# Patient Record
Sex: Male | Born: 1965 | Race: White | Hispanic: No | Marital: Married | Smoking: Former smoker
Health system: Southern US, Community
[De-identification: ages and names within clinical notes are randomized; demographics above are authoritative.]

## PROBLEM LIST (undated history)

## (undated) DIAGNOSIS — R519 Headache, unspecified: Secondary | ICD-10-CM

## (undated) DIAGNOSIS — G43909 Migraine, unspecified, not intractable, without status migrainosus: Secondary | ICD-10-CM

## (undated) DIAGNOSIS — F32A Depression, unspecified: Secondary | ICD-10-CM

---

## 2020-10-14 ENCOUNTER — Inpatient Hospital Stay (HOSPITAL_COMMUNITY)
Admission: EM | Admit: 2020-10-14 | Discharge: 2020-10-18 | DRG: 082 | Disposition: A | Discharge status: Home Health | Attending: Neurosurgery | Admitting: Neurosurgery

## 2020-10-14 ENCOUNTER — Emergency Department (HOSPITAL_COMMUNITY)

## 2020-10-14 ENCOUNTER — Encounter (HOSPITAL_COMMUNITY): Payer: Self-pay | Admitting: Neurosurgery

## 2020-10-14 DIAGNOSIS — R600 Localized edema: Secondary | ICD-10-CM | POA: Diagnosis present

## 2020-10-14 DIAGNOSIS — Z23 Encounter for immunization: Secondary | ICD-10-CM

## 2020-10-14 DIAGNOSIS — U071 COVID-19: Secondary | ICD-10-CM | POA: Diagnosis not present

## 2020-10-14 DIAGNOSIS — T1490XA Injury, unspecified, initial encounter: Secondary | ICD-10-CM | POA: Diagnosis present

## 2020-10-14 DIAGNOSIS — S0633AA Contusion and laceration of cerebrum, unspecified, with loss of consciousness status unknown, initial encounter: Secondary | ICD-10-CM | POA: Diagnosis present

## 2020-10-14 DIAGNOSIS — S06339A Contusion and laceration of cerebrum, unspecified, with loss of consciousness of unspecified duration, initial encounter: Secondary | ICD-10-CM | POA: Diagnosis not present

## 2020-10-14 DIAGNOSIS — S0083XA Contusion of other part of head, initial encounter: Secondary | ICD-10-CM

## 2020-10-14 HISTORY — DX: Headache, unspecified: R51.9

## 2020-10-14 HISTORY — DX: Depression, unspecified: F32.A

## 2020-10-14 HISTORY — DX: Migraine, unspecified, not intractable, without status migrainosus: G43.909

## 2020-10-14 LAB — I-STAT CHEM 8, ED
BUN: 12 mg/dL (ref 6–20)
Calcium, Ion: 1.13 mmol/L — ABNORMAL LOW (ref 1.15–1.40)
Chloride: 106 mmol/L (ref 98–111)
Creatinine, Ser: 0.8 mg/dL (ref 0.61–1.24)
Glucose, Bld: 108 mg/dL — ABNORMAL HIGH (ref 70–99)
HCT: 42 % (ref 39.0–52.0)
Hemoglobin: 14.3 g/dL (ref 13.0–17.0)
Potassium: 3.6 mmol/L (ref 3.5–5.1)
Sodium: 145 mmol/L (ref 135–145)
TCO2: 25 mmol/L (ref 22–32)

## 2020-10-14 LAB — CBC
HCT: 36.4 % — ABNORMAL LOW (ref 39.0–52.0)
Hemoglobin: 11.9 g/dL — ABNORMAL LOW (ref 13.0–17.0)
MCH: 29.9 pg (ref 26.0–34.0)
MCHC: 32.7 g/dL (ref 30.0–36.0)
MCV: 91.5 fL (ref 80.0–100.0)
Platelets: 174 10*3/uL (ref 150–400)
RBC: 3.98 MIL/uL — ABNORMAL LOW (ref 4.22–5.81)
RDW: 11.9 % (ref 11.5–15.5)
WBC: 6.5 10*3/uL (ref 4.0–10.5)
nRBC: 0 % (ref 0.0–0.2)

## 2020-10-14 LAB — URINALYSIS, ROUTINE W REFLEX MICROSCOPIC
Bilirubin Urine: NEGATIVE
Glucose, UA: NEGATIVE mg/dL
Hgb urine dipstick: NEGATIVE
Ketones, ur: NEGATIVE mg/dL
Leukocytes,Ua: NEGATIVE
Nitrite: NEGATIVE
Protein, ur: NEGATIVE mg/dL
Specific Gravity, Urine: 1.02 (ref 1.005–1.030)
pH: 6.5 (ref 5.0–8.0)

## 2020-10-14 LAB — COMPREHENSIVE METABOLIC PANEL
ALT: 63 U/L — ABNORMAL HIGH (ref 0–44)
AST: 70 U/L — ABNORMAL HIGH (ref 15–41)
Albumin: 3.9 g/dL (ref 3.5–5.0)
Alkaline Phosphatase: 77 U/L (ref 38–126)
Anion gap: 11 (ref 5–15)
BUN: 10 mg/dL (ref 6–20)
CO2: 26 mmol/L (ref 22–32)
Calcium: 9 mg/dL (ref 8.9–10.3)
Chloride: 107 mmol/L (ref 98–111)
Creatinine, Ser: 0.88 mg/dL (ref 0.61–1.24)
GFR, Estimated: >60 mL/min (ref >60)
Glucose, Bld: 113 mg/dL — ABNORMAL HIGH (ref 70–99)
Potassium: 3.6 mmol/L (ref 3.5–5.1)
Sodium: 144 mmol/L (ref 135–145)
Total Bilirubin: 0.8 mg/dL (ref 0.3–1.2)
Total Protein: 6 g/dL — ABNORMAL LOW (ref 6.5–8.1)

## 2020-10-14 LAB — ETHANOL: Alcohol, Ethyl (B): <10 mg/dL (ref <10)

## 2020-10-14 LAB — RESP PANEL BY RT-PCR (FLU A&B, COVID) ARPGX2
Influenza A by PCR: NEGATIVE
Influenza B by PCR: NEGATIVE
SARS Coronavirus 2 by RT PCR: POSITIVE — AB

## 2020-10-14 LAB — SAMPLE TO BLOOD BANK

## 2020-10-14 LAB — PROTIME-INR
INR: 1.1 (ref 0.8–1.2)
Prothrombin Time: 13.9 seconds (ref 11.4–15.2)

## 2020-10-14 LAB — MRSA PCR SCREENING: MRSA by PCR: NEGATIVE

## 2020-10-14 LAB — LACTIC ACID, PLASMA: Lactic Acid, Venous: 1.8 mmol/L (ref 0.5–1.9)

## 2020-10-14 MED ORDER — ACETAMINOPHEN 650 MG RE SUPP: 650.0000 mg | Freq: Four times a day (QID) | RECTAL | Status: DC | PRN

## 2020-10-14 MED ORDER — SODIUM CHLORIDE 0.9% FLUSH: 3.0000 mL | INTRAVENOUS | Status: DC | PRN

## 2020-10-14 MED ORDER — MORPHINE SULFATE (PF) 4 MG/ML IV SOLN
4.0000 mg | Freq: Once | INTRAVENOUS | Status: AC
  Administered 2020-10-14: 4 mg via INTRAVENOUS
  Filled 2020-10-14: qty 1

## 2020-10-14 MED ORDER — ACETAMINOPHEN 325 MG PO TABS
650.0000 mg | ORAL_TABLET | Freq: Four times a day (QID) | ORAL | Status: DC | PRN
  Administered 2020-10-15: 650 mg via ORAL
  Filled 2020-10-14: qty 2

## 2020-10-14 MED ORDER — SODIUM CHLORIDE 0.9 % IV SOLN: 250.0000 mL | INTRAVENOUS | Status: DC | PRN

## 2020-10-14 MED ORDER — HYDROMORPHONE HCL 1 MG/ML IJ SOLN: 0.5000 mg | INTRAMUSCULAR | Status: DC | PRN

## 2020-10-14 MED ORDER — ONDANSETRON HCL 4 MG/2ML IJ SOLN
4.0000 mg | Freq: Once | INTRAMUSCULAR | Status: AC
  Administered 2020-10-14: 4 mg via INTRAVENOUS
  Filled 2020-10-14: qty 2

## 2020-10-14 MED ORDER — SODIUM CHLORIDE 0.9% FLUSH
3.0000 mL | Freq: Two times a day (BID) | INTRAVENOUS | Status: DC
  Administered 2020-10-15 – 2020-10-17 (×3): 3 mL via INTRAVENOUS

## 2020-10-14 MED ORDER — ONDANSETRON HCL 4 MG/2ML IJ SOLN: 4.0000 mg | Freq: Four times a day (QID) | INTRAMUSCULAR | Status: DC | PRN

## 2020-10-14 MED ORDER — TETANUS-DIPHTH-ACELL PERTUSSIS 5-2.5-18.5 LF-MCG/0.5 IM SUSY
0.5000 mL | PREFILLED_SYRINGE | Freq: Once | INTRAMUSCULAR | Status: AC
  Administered 2020-10-14: 0.5 mL via INTRAMUSCULAR
  Filled 2020-10-14: qty 0.5

## 2020-10-14 MED ORDER — LEVETIRACETAM 500 MG PO TABS
500.0000 mg | ORAL_TABLET | Freq: Two times a day (BID) | ORAL | Status: DC
  Administered 2020-10-14 – 2020-10-18 (×8): 500 mg via ORAL
  Filled 2020-10-14 (×8): qty 1

## 2020-10-14 MED ORDER — METHOCARBAMOL 1000 MG/10ML IJ SOLN
500.0000 mg | Freq: Four times a day (QID) | INTRAVENOUS | Status: DC | PRN
  Filled 2020-10-14: qty 5

## 2020-10-14 MED ORDER — ONDANSETRON HCL 4 MG PO TABS: 4.0000 mg | ORAL_TABLET | Freq: Four times a day (QID) | ORAL | Status: DC | PRN

## 2020-10-14 MED ORDER — OXYCODONE HCL 5 MG PO TABS: 5.0000 mg | ORAL_TABLET | ORAL | Status: DC | PRN

## 2020-10-14 MED ORDER — SODIUM CHLORIDE 0.9 % IV SOLN: INTRAVENOUS | Status: DC

## 2020-10-14 NOTE — Progress Notes (Signed)
   10/14/20 1200  Clinical Encounter Type  Visited With Patient not available  Visit Type Trauma  Referral From Nurse  Consult/Referral To Chaplain  The chaplain responded to page. The patient is unavailable but is being assessed. There is no family present. Chaplain will follow up as needed.

## 2020-10-14 NOTE — ED Notes (Signed)
[phone] French Ana Daughter
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
## 2020-10-15 ENCOUNTER — Observation Stay (HOSPITAL_COMMUNITY)

## 2020-10-15 DIAGNOSIS — U071 COVID-19: Secondary | ICD-10-CM | POA: Diagnosis present

## 2020-10-15 DIAGNOSIS — S06339A Contusion and laceration of cerebrum, unspecified, with loss of consciousness of unspecified duration, initial encounter: Secondary | ICD-10-CM | POA: Diagnosis present

## 2020-10-15 DIAGNOSIS — T1490XA Injury, unspecified, initial encounter: Secondary | ICD-10-CM | POA: Diagnosis present

## 2020-10-15 DIAGNOSIS — Z23 Encounter for immunization: Secondary | ICD-10-CM | POA: Diagnosis not present

## 2020-10-15 DIAGNOSIS — R600 Localized edema: Secondary | ICD-10-CM | POA: Diagnosis present

## 2020-10-15 LAB — HIV ANTIBODY (ROUTINE TESTING W REFLEX): HIV Screen 4th Generation wRfx: NONREACTIVE

## 2020-10-15 NOTE — Progress Notes (Signed)
Inpatient Rehab Admissions Coordinator Note:   Per PT recommendation, pt was screened for CIR candidacy by Wolfgang Phoenix, MS, CCC-SLP.  At this time we are not recommending an inpatient rehab consult.  Noted COVID + 10/14/20.  Patients are eligible to be considered for admit to CIR when cleared from airborne precautions by acute MD or Infectious Disease regardless of date of onset.  Otherwise, they will need to be >20 days from their positive test with recovery/improvement in symptoms or 2 negative tests.  Please contact me with questions.    Wolfgang Phoenix, MS, CCC-SLP Admissions Coordinator [phone] 10/15/20 4:54 PM
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
## 2020-10-15 NOTE — Progress Notes (Signed)
Patient alert and oriented to self, time and place overnight but disoriented to situation. Very restless in bed but every time pain was assessed he denied having any pain and denied pain medication, always stating it was 0/10. Full movement and strength in all extremities, R eye round and briskly reactive, unable to assess L eye due to swelling. Patient a standby steadying assist when getting up to use urinal or transfer to chair.

## 2020-10-15 NOTE — Progress Notes (Signed)
   10/15/20 0824  Vitals  BP (!) 141/93  MAP (mmHg) 110  BP Location Left Arm  BP Method Automatic  Patient Position (if appropriate) Lying  Pulse Rate 61  ECG Heart Rate 64  Resp 18  MEWS COLOR  MEWS Score Color Green  Oxygen Therapy  SpO2 95 %  O2 Device Room Air  MEWS Score  MEWS Temp 0  MEWS Systolic 0  MEWS Pulse 0  MEWS RR 0  MEWS LOC 0  MEWS Score 0  Patient was reported to be alert orient to self and place and situation overnight. This morning he is only oriented to self. He was able to tell me his 2 names. Appears sleepy and restless. Noted stat order for CT.head.
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
## 2020-10-16 NOTE — Progress Notes (Signed)
SLP Cancellation Note  Patient Details Name: Rick Valencia MRN: [id number] DOB: 1965-11-22   Cancelled treatment:       Pt unable/unwilling to participate in cognitive communication evaluation at this time. Pt answered very few questions despite significant encouragement to participate. 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 He was unable to tell me what he does for a living. Will continue efforts.  Geneieve Duell B. Murvin Natal, [facility], CCC-SLP Speech Language Pathologist Office: [phone] Pager: [phone]  Leigh Aurora 10/16/2020, 11:08 AM

## 2020-10-16 NOTE — Patient Instructions (Addendum)
Concussion, Adult  A concussion is a brain injury from a hard, direct hit (trauma) to your head or body. This direct hit causes your brain to quickly shake back and forth inside your skull. A concussion may also be called a mild traumatic brain injury (TBI). Healing from this injury can take time. What are the causes? This condition is caused by:  A direct hit to your head, such as: ? Running into a player during a game. ? Being hit in a fight. ? Hitting your head on a hard surface.  A quick and sudden movement of the head or neck, such as in a car crash. What are the signs or symptoms? The signs of a concussion can be hard to notice. They may be missed by you, family members, and doctors. You may look fine on the outside but may not act or feel normal. Physical symptoms  Headaches.  Being dizzy.  Problems with body balance.  Being sensitive to light or noise.  Vomiting or feeling like you may vomit.  Being tired.  Problems seeing or hearing.  Not sleeping or eating as you used to.  Seizure. Mental and emotional symptoms  Feeling grouchy (irritable).  Having mood changes.  Problems remembering things.  Trouble focusing your mind (concentrating), organizing, or making decisions.  Being slow to think, act, react, speak, or read.  Feeling worried or nervous (anxious).  Feeling sad (depressed). How is this treated? This condition may be treated by:  Stopping sports or activity if you are injured. If you hit your head or have signs of concussion: ? Do not return to sports or activities the same day. ? Get checked by a doctor before you return to your activities.  Resting your body and your mind.  Being watched carefully, often at home.  Medicines to help with symptoms such as: ? Headaches. ? Feeling like you may vomit. ? Problems with sleep.  Avoiding alcohol and drugs.  Being asked to go to a concussion clinic or a place to help you recover  (rehabilitation center). Recovery from a concussion can take time. Return to activities only:  When you are fully healed.  When your doctor says it is safe. Avoid taking strong pain medicines (opioids) for a concussion. Follow these instructions at home: Activity  Limit activities that need a lot of thought or focus, such as: ? Homework or work for your job. ? Watching TV. ? Using the computer or phone. ? Playing memory games and puzzles.  Rest. Rest helps your brain heal. Make sure you: ? Get plenty of sleep. Most adults should get 7-9 hours of sleep each night. ? Rest during the day. Take naps or breaks when you feel tired.  Avoid activity like exercise until your doctor says its safe. Stop any activity that makes symptoms worse.  Do not do activities that could cause a second concussion, such as riding a bike or playing sports.  Ask your doctor when you can return to your normal activities, such as school, work, sports, and driving. Your ability to react may be slower. Do not do these activities if you are dizzy. General instructions  Take over-the-counter and prescription medicines only as told by your doctor.  Do not drink alcohol until your doctor says you can.  Watch your symptoms and tell other people to do the same. Other problems can occur after a concussion. Older adults have a higher risk of serious problems.  Tell your work manager, teachers, school nurse, school   counselor, coach, or athletic trainer about your injury and symptoms. Tell them about what you can or cannot do.  Keep all follow-up visits as told by your doctor. This is important.   How is this prevented? It is very important that you do not get another brain injury. In rare cases, another injury can cause brain damage that will not go away, brain swelling, or death. The risk of this is greatest in the first 7-10 days after a head injury. To avoid injuries:  Stop activities that could lead to a second  concussion, such as contact sports, until your doctor says it is okay.  When you return to sports or activities: ? Do not crash into other players. This is how most concussions happen. ? Follow the rules. ? Respect other players. Do not engage in violent behavior while playing.  Get regular exercise. Do strength and balance training.  Wear a helmet that fits you well during sports, biking, or other activities.  Helmets can help protect you from serious skull and brain injuries, but they do not protect you from a concussion. Even when wearing a helmet, you should avoid being hit in the head. Contact a doctor if:  Your symptoms do not get better.  You have new symptoms.  You have another injury. Get help right away if:  You have bad headaches or your headaches get worse.  You feel weak or numb in any part of your body.  You feel mixed up (confused).  Your balance gets worse.  You vomit often.  You feel more sleepy than normal.  You cannot speak well, or have slurred speech.  You have a seizure.  Others have trouble waking you up.  You have changes in how you act.  You have changes in how you see (vision).  You pass out (lose consciousness). These symptoms may be an emergency. Do not wait to see if the symptoms will go away. Get medical help right away. Call your local emergency services (911 in the [place] Do not drive yourself to the hospital. Summary  A concussion is a brain injury from a hard, direct hit (trauma) to your head or body.  This condition is treated with rest and careful watching of symptoms.  Ask your doctor when you can return to your normal activities, such as school, work, or driving.  Get help right away if you have a very bad headache, feel weak in any part of your body, have a seizure, have changes in how you act or see, or if you are mixed up or more sleepy than normal. This information is not intended to replace advice given to you by your  health care provider. Make sure you discuss any questions you have with your health care provider. Document Revised: 07/01/2019 Document Reviewed: 07/01/2019 Elsevier Patient Education  2021 [employer]  

## 2020-10-16 NOTE — Progress Notes (Addendum)
Patient is more alert and awake today with RN. He was able to ambulate in room to [facility] and chair with standby assist. He answers to some of my questions appropriately, follow command but noted withdrawn and not interactive.  PA at bedside ok to d/c TELE/TELE sitter and IVF at this time, patient possible be discharge today.
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
## 2020-10-17 LAB — SARS CORONAVIRUS 2 (TAT 6-24 HRS): SARS Coronavirus 2: NEGATIVE
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
## 2020-10-18 LAB — SARS CORONAVIRUS 2 (TAT 6-24 HRS): SARS Coronavirus 2: NEGATIVE

## 2020-10-18 MED ORDER — LEVETIRACETAM 500 MG PO TABS: 500.0000 mg | ORAL_TABLET | Freq: Two times a day (BID) | ORAL | 0 refills | Status: AC

## 2020-10-18 NOTE — Discharge Instructions (Signed)
Facial or Scalp Contusion A facial or scalp contusion is a deep bruise (contusion) on the face or head. Bruises happen when an injury causes bleeding under the skin. The bruise may turn blue, purple, or yellow. Minor injuries will cause a bruise that is not painful, but worse bruises can stay painful and swollen for a few weeks. Injuries to the face and head usually cause a lot of swelling, especially around the eyes. What are the causes?  An injury to the face or head from an object.  A fall.  A hit to the face or head area. What are the signs or symptoms?  Swelling in the area of the injury.  The injured area being a different color from normal.  Pain or soreness in the injured area. How is this treated?  Applying cold compresses to the hurt area. This is often the best treatment.  Taking over-the-counter medicines to help take the pain away, if your doctor tells you to take them. Follow these instructions at home: Managing pain, stiffness, and swelling  If told, put ice on the injured area. To do this: ? Put ice in a plastic bag. ? Place a towel between your skin and the bag. ? Leave the ice on for 20 minutes, 2-3 times a day.  If possible, raise (elevate) your head above the level of your heart while you are lying down. This may help reduce swelling.   General instructions  Take over-the-counter and prescription medicines only as told by your doctor.  Rest as told by your doctor.  Return to your normal activities as told by your doctor. Ask your doctor what activities are safe for you.  Keep all follow-up visits as told by your doctor. This is important. Contact a doctor if:  You have trouble biting or chewing.  Your pain or swelling gets worse.  You have pain when you move your eyes. Get help right away if:  You have very bad pain or a headache, and medicine does not help.  You are very tired or confused.  Your personality changes.  You vomit.  You have a  nosebleed that does not stop.  You see two of everything (double vision) or have blurry vision.  You have clear fluid coming from your nose or ear, and it does not go away.  You have problems walking or using your arms or legs.  You feel very dizzy. Summary  A facial or scalp contusion is a deep bruise (contusion) on the face or head.  Bruises happen when an injury causes bleeding under the skin.  Minor injuries will cause a bruise that is not painful, but worse bruises can stay painful and swollen for a few weeks.  Applying cold compresses to the hurt area is often the best treatment. This information is not intended to replace advice given to you by your health care provider. Make sure you discuss any questions you have with your health care provider. Document Revised: 08/11/2019 Document Reviewed: 08/11/2019 Elsevier Patient Education  2021 [employer].

## 2020-10-18 NOTE — TOC Transition Note (Signed)
Transition of Care (TOC) - CM/SW Discharge Note Donn Pierini RN,BSN Transitions of Care Unit 4NP (non trauma) - RN Case Manager See Treatment Team for direct Phone #   Patient Details  Name: Rick Valencia MRN: [id number] Date of Birth: 10-07-1965  Transition of Care [facility]) CM/SW Contact:  Darrold Span, RN Phone Number: 10/18/2020, 2:21 PM   Clinical Narrative:    Pt stable for transition home, 2nd COVID test negative. Isolation precautions discontinued per ID. Wife agreeable for pt to return home . Orders for HH placed PT/OT/SLP. Call made to wife to discuss HH needs- per wife she is currently active with [facility] post release from INPT rehab she would like to see if they can also provide needed services for husband.  Confirmed with wife that daughter will be coming to provide transportation home.  Call made to Kensey with [facility] for [facility] referral- Kensey familiar with wife who is active pt- referral has been accepted for husband as well.  Return call made to wife to let her know [facility] as also accepted referral on her husband for [facility] needs and would be called to set up his visits along with hers.    Final next level of care: Home w Home Health Services Barriers to Discharge: No Barriers Identified   Patient Goals and CMS Choice Patient states their goals for this hospitalization and ongoing recovery are:: return home CMS Medicare.gov Compare Post Acute Care list provided to:: Patient Represenative (must comment) Choice offered to / list presented to : Spouse  Discharge Placement               Home with [facility]        Discharge Plan and Services   Discharge Planning Services: CM Consult Post Acute Care Choice: Home Health          DME Arranged: N/A DME Agency: NA       HH Arranged: PT,OT,Speech Therapy HH Agency: [facility] Date HH Agency Contacted: 10/18/20 Time HH Agency Contacted: 1419 Representative spoke with at [facility] Agency: Pearson Grippe  Social  Determinants of Health (SDOH) Interventions     Readmission Risk Interventions Readmission Risk Prevention Plan 10/18/2020  Post Dischage Appt Complete  Medication Screening Complete  Transportation Screening Complete

## 2020-10-18 NOTE — Progress Notes (Signed)
Discharge instructions given to patient and daughter, both verbalize understanding, deny questions.  No changes noted in assessment.  NAD.  Belongings returned from security and all belongings accounted for per patient.  Patient discharged home with home health via private vehicle with daughter.

## 2020-10-18 NOTE — Progress Notes (Signed)
Repeat covid test from today negative.  Called Dr. Val Riles office and left a message to determine plan.  Informed him that patient most likely will not need CIR and can probably go home with home health as discussed in progression today with social work.  Awaiting call back.

## 2020-10-18 NOTE — Progress Notes (Signed)
  NEUROSURGERY PROGRESS NOTE   Repeat COVID test negative.  Patient has 2- tests  24 hours apart.  Reviewed with Infectious Disease.  Patient is not considered with active COVID19 infection. Does not need to be under isolation.  Nursing called   Patient's wife regarding his repeat negative COVID test.  Per nursing, wife is okay with patient being discharged home.  Order entered for discharge.
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
## 2020-10-18 NOTE — Plan of Care (Signed)
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
## 2022-07-28 ENCOUNTER — Emergency Department (HOSPITAL_BASED_OUTPATIENT_CLINIC_OR_DEPARTMENT_OTHER): Admitting: Radiology

## 2022-07-28 ENCOUNTER — Emergency Department (HOSPITAL_BASED_OUTPATIENT_CLINIC_OR_DEPARTMENT_OTHER)
Admission: EM | Admit: 2022-07-28 | Discharge: 2022-07-28 | Disposition: A | Discharge status: Home / Self Care | Attending: Emergency Medicine | Admitting: Emergency Medicine

## 2022-07-28 ENCOUNTER — Other Ambulatory Visit: Payer: Self-pay

## 2022-07-28 DIAGNOSIS — R519 Headache, unspecified: Secondary | ICD-10-CM | POA: Insufficient documentation

## 2022-07-28 DIAGNOSIS — R21 Rash and other nonspecific skin eruption: Secondary | ICD-10-CM | POA: Diagnosis present

## 2022-07-28 DIAGNOSIS — D72829 Elevated white blood cell count, unspecified: Secondary | ICD-10-CM | POA: Diagnosis not present

## 2022-07-28 DIAGNOSIS — L03113 Cellulitis of right upper limb: Secondary | ICD-10-CM | POA: Diagnosis not present

## 2022-07-28 DIAGNOSIS — Z20822 Contact with and (suspected) exposure to covid-19: Secondary | ICD-10-CM | POA: Diagnosis not present

## 2022-07-28 LAB — RESP PANEL BY RT-PCR (FLU A&B, COVID) ARPGX2
Influenza A by PCR: NEGATIVE
Influenza B by PCR: NEGATIVE
SARS Coronavirus 2 by RT PCR: NEGATIVE

## 2022-07-28 LAB — COMPREHENSIVE METABOLIC PANEL
ALT: 19 U/L (ref 0–44)
AST: 13 U/L — ABNORMAL LOW (ref 15–41)
Albumin: 4.4 g/dL (ref 3.5–5.0)
Alkaline Phosphatase: 75 U/L (ref 38–126)
Anion gap: 10 (ref 5–15)
BUN: 14 mg/dL (ref 6–20)
CO2: 27 mmol/L (ref 22–32)
Calcium: 9.3 mg/dL (ref 8.9–10.3)
Chloride: 103 mmol/L (ref 98–111)
Creatinine, Ser: 0.77 mg/dL (ref 0.61–1.24)
GFR, Estimated: >60 mL/min (ref >60)
Glucose, Bld: 113 mg/dL — ABNORMAL HIGH (ref 70–99)
Potassium: 3.7 mmol/L (ref 3.5–5.1)
Sodium: 140 mmol/L (ref 135–145)
Total Bilirubin: 0.8 mg/dL (ref 0.3–1.2)
Total Protein: 7.1 g/dL (ref 6.5–8.1)

## 2022-07-28 LAB — CBC WITH DIFFERENTIAL/PLATELET
Abs Immature Granulocytes: 0.15 10*3/uL — ABNORMAL HIGH (ref 0.00–0.07)
Basophils Absolute: 0.1 10*3/uL (ref 0.0–0.1)
Basophils Relative: 0 %
Eosinophils Absolute: 0 10*3/uL (ref 0.0–0.5)
Eosinophils Relative: 0 %
HCT: 44.1 % (ref 39.0–52.0)
Hemoglobin: 14.9 g/dL (ref 13.0–17.0)
Immature Granulocytes: 1 %
Lymphocytes Relative: 3 %
Lymphs Abs: 0.8 10*3/uL (ref 0.7–4.0)
MCH: 30.3 pg (ref 26.0–34.0)
MCHC: 33.8 g/dL (ref 30.0–36.0)
MCV: 89.6 fL (ref 80.0–100.0)
Monocytes Absolute: 0.7 10*3/uL (ref 0.1–1.0)
Monocytes Relative: 3 %
Neutro Abs: 23.7 10*3/uL — ABNORMAL HIGH (ref 1.7–7.7)
Neutrophils Relative %: 93 %
Platelets: 219 10*3/uL (ref 150–400)
RBC: 4.92 MIL/uL (ref 4.22–5.81)
RDW: 11.9 % (ref 11.5–15.5)
Smear Review: NORMAL
WBC: 25.4 10*3/uL — ABNORMAL HIGH (ref 4.0–10.5)
nRBC: 0 % (ref 0.0–0.2)

## 2022-07-28 LAB — LACTIC ACID, PLASMA
Lactic Acid, Venous: 1.9 mmol/L (ref 0.5–1.9)
Lactic Acid, Venous: 0.9 mmol/L (ref 0.5–1.9)

## 2022-07-28 MED ORDER — LACTATED RINGERS IV SOLN: INTRAVENOUS | Status: DC

## 2022-07-28 MED ORDER — SODIUM CHLORIDE 0.9 % IV SOLN: INTRAVENOUS | Status: DC | PRN

## 2022-07-28 MED ORDER — VANCOMYCIN HCL IN DEXTROSE 1-5 GM/200ML-% IV SOLN
1000.0000 mg | Freq: Once | INTRAVENOUS | Status: AC
  Administered 2022-07-28: 1000 mg via INTRAVENOUS
  Filled 2022-07-28: qty 200

## 2022-07-28 MED ORDER — SODIUM CHLORIDE 0.9 % IV SOLN
2.0000 g | Freq: Once | INTRAVENOUS | Status: AC
  Administered 2022-07-28: 2 g via INTRAVENOUS
  Filled 2022-07-28: qty 20

## 2022-07-28 MED ORDER — CEPHALEXIN 500 MG PO CAPS: 500.0000 mg | ORAL_CAPSULE | Freq: Four times a day (QID) | ORAL | 0 refills | Status: AC

## 2022-07-28 MED ORDER — ACETAMINOPHEN 500 MG PO TABS
1000.0000 mg | ORAL_TABLET | Freq: Once | ORAL | Status: AC
  Administered 2022-07-28: 1000 mg via ORAL
  Filled 2022-07-28: qty 2

## 2022-07-28 MED ORDER — VANCOMYCIN HCL 500 MG IV SOLR
500.0000 mg | Freq: Once | INTRAVENOUS | Status: AC
  Administered 2022-07-28: 500 mg via INTRAVENOUS

## 2022-07-28 MED ORDER — LACTATED RINGERS IV BOLUS (SEPSIS)
1000.0000 mL | Freq: Once | INTRAVENOUS | Status: AC
  Administered 2022-07-28: 1000 mL via INTRAVENOUS

## 2022-07-28 MED ORDER — DOXYCYCLINE HYCLATE 100 MG PO CAPS: 100.0000 mg | ORAL_CAPSULE | Freq: Two times a day (BID) | ORAL | 0 refills | Status: AC

## 2022-07-28 NOTE — Discharge Instructions (Addendum)
Watch for signs of worsening redness, pain, swelling of the right upper extremity despite oral antibiotics.  Return to the emergency department for low blood pressure, worsening symptoms, worsening pain, change in mental status, or any other complaint or concern.  We discussed admission for observation in the setting of needing potential sepsis criteria in the setting of a cellulitis, after consideration of the risk and benefits, you have elected to trial a course of outpatient antibiotic therapy.

## 2022-07-28 NOTE — Sepsis Progress Note (Signed)
Pt discharged prior to end of sepsis monitoring. Second blood culture drawn after abx given

## 2022-07-28 NOTE — ED Notes (Signed)
Blue, gold and 1st set of cultures sent to lab.

## 2022-07-28 NOTE — Progress Notes (Signed)
Pharmacy Antibiotic Note  Rick Valencia is a 56 y.o. male admitted on 07/28/2022 with sepsis.  Pharmacy has been consulted for vancomycin dosing. Patient has cellulitis in the R upper extremity.   WBC 25.4/LA 0.9/Tmax 100.F  Plan: Vancomycin 1500mg  IV as loading dose followed by vancomycin 1000 mg q12h (goal 400-550; predicted AUC 457) Vanc levels PRN per protocol F?u renal func, levels, LOT   Height: 5\' 4"  (162.6 cm) Weight: 69.4 kg (153 lb) IBW/kg (Calculated) : 59.2  Temp (24hrs), Avg:100 F (37.8 C), Min:100 F (37.8 C), Max:100 F (37.8 C)  Recent Labs  Lab 07/28/22 1004 07/28/22 1209  WBC 25.4*  --   CREATININE 0.77  --   LATICACIDVEN 1.9 0.9    Estimated Creatinine Clearance: 86.3 mL/min (by C-G formula based on SCr of 0.77 mg/dL).    No Known Allergies  Antimicrobials this admission: Vanc 11/26>  Dose adjustments this admission:   Microbiology results: 11/26 BCx:  11/26 resp panel:  Thank you for allowing pharmacy to be a part of this patient's care.  12/26, PharmD Clinical Pharmacist 07/28/2022 1:21 PM

## 2022-07-28 NOTE — ED Provider Notes (Signed)
[facility] EMERGENCY DEPT Provider Note   CSN: [id number] Arrival date & time: 07/28/22  0932     History  Chief Complaint  Patient presents with   Headache   Rash    Rick Valencia is a 56 y.o. male.   Headache Associated symptoms: fever   Rash Associated symptoms: fever and headaches      56 year old male with a history of migraine headaches presenting to emergency [name] with 24 hours of a rash along his right upper arm.  He endorses fever, headache.  He denies any neck stiffness.  He states that he is unsure if he was bit by an insect.  He endorses fevers at home, worsening redness and pain along his right arm.  He denies any falls or trauma.  He denies any other complaints.  Home Medications Prior to Admission medications   Medication Sig Start Date End Date Taking? Authorizing Provider  cephALEXin (KEFLEX) 500 MG capsule Take 1 capsule (500 mg total) by mouth 4 (four) times daily. 07/28/22  Yes Ernie Avena, MD  doxycycline (VIBRAMYCIN) 100 MG capsule Take 1 capsule (100 mg total) by mouth 2 (two) times daily for 5 days. 07/28/22 08/02/22 Yes Ernie Avena, MD  cholecalciferol (VITAMIN D3) 25 MCG (1000 UNIT) tablet Take 2,000 Units by mouth daily.    [provider]  Erenumab-aooe (AIMOVIG, 140 MG DOSE, Gate) Inject 140 mg into the skin every 30 (thirty) days.    [provider]  levETIRAcetam (KEPPRA) 500 MG tablet Take 1 tablet (500 mg total) by mouth 2 (two) times daily. 10/18/20   Costella, Darci Current, PA-C  naproxen (NAPROSYN) 250 MG tablet Take 250 mg by mouth 2 (two) times daily with a meal.    [provider]  sertraline (ZOLOFT) 100 MG tablet Take 150 mg by mouth daily.    [provider]  SUMAtriptan (IMITREX) 100 MG tablet Take 100 mg by mouth every 2 (two) hours as needed for migraine. May repeat in 2 hours if headache persists or recurs.    [provider]      Allergies    Patient has no known  allergies.    Review of Systems   Review of Systems  Constitutional:  Positive for fever.  Skin:  Positive for rash.  Neurological:  Positive for headaches.  All other systems reviewed and are negative.   Physical Exam Updated Vital Signs BP 95/68   Pulse 87   Temp 100 F (37.8 C) (Oral)   Resp 16   Ht 5\' 4"  (1.626 m)   Wt 69.4 kg   SpO2 93%   BMI 26.26 kg/m  Physical Exam Vitals and nursing note reviewed.  Constitutional:      General: He is not in acute distress.    Appearance: He is well-developed.  HENT:     Head: Normocephalic and atraumatic.  Eyes:     Conjunctiva/sclera: Conjunctivae normal.  Neck:     Comments: No meningismus. Cardiovascular:     Rate and Rhythm: Normal rate and regular rhythm.  Pulmonary:     Effort: Pulmonary effort is normal. No respiratory distress.     Breath sounds: Normal breath sounds.  Abdominal:     Palpations: Abdomen is soft.     Tenderness: There is no abdominal tenderness.  Musculoskeletal:        General: No swelling.     Cervical back: Full passive range of motion without pain and neck supple.     Comments: Intact range  of motion of the right elbow without pain, no elbow joint swelling  Skin:    General: Skin is warm and dry.     Capillary Refill: Capillary refill takes less than 2 seconds.     Comments: Area of cellulitis along the right forearm to the right upper extremity, no crepitus palpated, erythema, warmth to the touch  Neurological:     Mental Status: He is alert.  Psychiatric:        Mood and Affect: Mood normal.     ED Results / Procedures / Treatments   Labs (all labs ordered are listed, but only abnormal results are displayed) Labs Reviewed  COMPREHENSIVE METABOLIC PANEL - Abnormal; Notable for the following components:      Result Value   Glucose, Bld 113 (*)    AST 13 (*)    All other components within normal limits  CBC WITH DIFFERENTIAL/PLATELET - Abnormal; Notable for the following components:    WBC 25.4 (*)    Neutro Abs 23.7 (*)    Abs Immature Granulocytes 0.15 (*)    All other components within normal limits  RESP PANEL BY RT-PCR (FLU A&B, COVID) ARPGX2  CULTURE, BLOOD (ROUTINE X 2)  CULTURE, BLOOD (ROUTINE X 2)  LACTIC ACID, PLASMA  LACTIC ACID, PLASMA    EKG None  Radiology No results found.  Procedures Procedures    Medications Ordered in ED Medications  lactated ringers infusion ( Intravenous New Bag/Given 07/28/22 1244)  cefTRIAXone (ROCEPHIN) 2 g in sodium chloride 0.9 % 100 mL IVPB (2 g Intravenous New Bag/Given 07/28/22 1410)  vancomycin (VANCOCIN) 500 mg in sodium chloride 0.9 % 100 mL IVPB (has no administration in time range)  0.9 %  sodium chloride infusion ( Intravenous New Bag/Given 07/28/22 1409)  lactated ringers bolus 1,000 mL (1,000 mLs Intravenous New Bag/Given 07/28/22 1252)  vancomycin (VANCOCIN) IVPB 1000 mg/200 mL premix (1,000 mg Intravenous New Bag/Given 07/28/22 1246)  acetaminophen (TYLENOL) tablet 1,000 mg (1,000 mg Oral Given 07/28/22 1239)    ED Course/ Medical Decision Making/ A&P                           Medical Decision Making Amount and/or Complexity of Data Reviewed Labs: ordered. Radiology: ordered.  Risk OTC drugs. Prescription drug management.     56 year old male with a history of migraine headaches presenting to emergency [name] with 24 hours of a rash along his right upper arm.  He endorses fever, headache.  He denies any neck stiffness.  He states that he is unsure if he was bit by an insect.  He endorses fevers at home, worsening redness and pain along his right arm.  He denies any falls or trauma.  He denies any other complaints.  On arrival, the patient was found to have cellulitis in the right upper extremity.  He was vitally stable, afebrile, temperature 100, borderline tachycardic pulse 96, RR 16, BP 112/80, saturating 96% on room air.  Patient's right upper extremity was neurovascularly intact.   Differential diagnosis includes sepsis, cellulitis, less likely necrotizing fasciitis with no crepitus although considered.  Initial laboratory evaluation significant for leukocytosis 25.4, CMP without significant abnormality, COVID-19 influenza testing normal, lactic acid normal.  Code sepsis was called based on the patient's initial borderline elevated heart rate (he is on a beta-blocker) and leukocytosis to 25.4.  IV access was obtained the patient was started on IV vancomycin and Rocephin and administered an IV fluid bolus.  Tylenol was administered for pain.  The patient had no meningeal findings on exam and is GCS 15, alert and oriented x3.  Low concern for meningitis or encephalitis at this time.  Imaging:  Upon reassessment, the patient appears clinically improved. The patient is not currently hypotensive and has a MAP of >65. The patient's volume status appears to be improved after fluid resuscitation.  On repeat evaluation, the patient was normotensive with a blood pressure of 120 systolic, his heart rate was in the 70s.  Discussed and considered admission for observation in the setting cellulitis versus discharge home on oral antibiotics.  After consideration of the risks and benefits, the patient would prefer to be discharged.  We will discharge the patient on Keflex and doxycycline.  Strict return precautions provided in the event of worsening of symptoms.  The patient has ability to monitor his blood pressure at home.  He is well-appearing.  He denies any pain.  He is headache has completely resolved.  He states his arm pain has improved.  Plan for discharge with close outpatient follow-up, close return precautions provided.  While in the ED, the patient was given: Medications  lactated ringers infusion ( Intravenous New Bag/Given 07/28/22 1244)  cefTRIAXone (ROCEPHIN) 2 g in sodium chloride 0.9 % 100 mL IVPB (2 g Intravenous New Bag/Given 07/28/22 1410)  vancomycin (VANCOCIN) 500 mg in  sodium chloride 0.9 % 100 mL IVPB (has no administration in time range)  0.9 %  sodium chloride infusion ( Intravenous New Bag/Given 07/28/22 1409)  lactated ringers bolus 1,000 mL (1,000 mLs Intravenous New Bag/Given 07/28/22 1252)  vancomycin (VANCOCIN) IVPB 1000 mg/200 mL premix (1,000 mg Intravenous New Bag/Given 07/28/22 1246)  acetaminophen (TYLENOL) tablet 1,000 mg (1,000 mg Oral Given 07/28/22 1239)     Final Clinical Impression(s) / ED Diagnoses Final diagnoses:  Cellulitis of right upper extremity    Rx / DC Orders ED Discharge Orders          Ordered    cephALEXin (KEFLEX) 500 MG capsule  4 times daily        07/28/22 1439    doxycycline (VIBRAMYCIN) 100 MG capsule  2 times daily        07/28/22 1439              Ernie Avena, MD 07/28/22 1439

## 2022-07-28 NOTE — Sepsis Progress Note (Signed)
Elink following code sepsis °

## 2022-07-28 NOTE — ED Notes (Signed)
Blood culture collected before antibiotic start.  Unable to obtain second blood culture before start of antibiotic.

## 2022-08-02 LAB — CULTURE, BLOOD (ROUTINE X 2)
Culture: NO GROWTH
Special Requests: ADEQUATE
